# Patient Record
Sex: Female | Born: 1965 | Race: Black or African American | Hispanic: No | Marital: Married | Smoking: Never smoker
Health system: Southern US, Community
[De-identification: ages and names within clinical notes are randomized; demographics above are authoritative.]

## PROBLEM LIST (undated history)

## (undated) DIAGNOSIS — R112 Nausea with vomiting, unspecified: Secondary | ICD-10-CM

## (undated) DIAGNOSIS — D649 Anemia, unspecified: Secondary | ICD-10-CM

## (undated) DIAGNOSIS — M797 Fibromyalgia: Secondary | ICD-10-CM

## (undated) DIAGNOSIS — G43109 Migraine with aura, not intractable, without status migrainosus: Secondary | ICD-10-CM

## (undated) DIAGNOSIS — G43809 Other migraine, not intractable, without status migrainosus: Secondary | ICD-10-CM

## (undated) DIAGNOSIS — Z9889 Other specified postprocedural states: Secondary | ICD-10-CM

## (undated) DIAGNOSIS — E282 Polycystic ovarian syndrome: Secondary | ICD-10-CM

## (undated) HISTORY — PX: APPENDECTOMY: SHX54

## (undated) HISTORY — PX: ABDOMINAL HYSTERECTOMY: SHX81

---

## 2007-05-14 ENCOUNTER — Ambulatory Visit: Payer: Self-pay | Admitting: Internal Medicine

## 2007-10-04 ENCOUNTER — Ambulatory Visit: Payer: Self-pay | Admitting: Family Medicine

## 2009-01-28 ENCOUNTER — Ambulatory Visit: Payer: Self-pay | Admitting: Family Medicine

## 2012-05-30 ENCOUNTER — Ambulatory Visit: Payer: Self-pay

## 2012-05-30 LAB — CBC WITH DIFFERENTIAL/PLATELET
Basophil #: 0.1 10*3/uL (ref 0.0–0.1)
Basophil %: 1 %
Eosinophil #: 0.1 10*3/uL (ref 0.0–0.7)
Eosinophil %: 1.3 %
HCT: 39.7 % (ref 35.0–47.0)
HGB: 13.1 g/dL (ref 12.0–16.0)
Lymphocyte %: 27.6 %
MCH: 32.3 pg (ref 26.0–34.0)
MCHC: 33 g/dL (ref 32.0–36.0)
MCV: 98 fL (ref 80–100)
Monocyte %: 6.6 %
Neutrophil #: 3.3 10*3/uL (ref 1.4–6.5)
Platelet: 160 10*3/uL (ref 150–440)
RDW: 13.6 % (ref 11.5–14.5)

## 2012-05-30 LAB — URINALYSIS, COMPLETE
Bilirubin,UR: NEGATIVE
Ketone: NEGATIVE
Leukocyte Esterase: NEGATIVE
Nitrite: NEGATIVE
Specific Gravity: 1.01 (ref 1.003–1.030)
WBC UR: NONE SEEN /HPF (ref 0–5)

## 2012-05-30 LAB — BASIC METABOLIC PANEL
BUN: 14 mg/dL (ref 7–18)
Chloride: 103 mmol/L (ref 98–107)
EGFR (Non-African Amer.): >60

## 2012-08-01 ENCOUNTER — Ambulatory Visit: Payer: Self-pay | Admitting: Family Medicine

## 2014-02-20 ENCOUNTER — Emergency Department: Payer: Self-pay | Admitting: Emergency Medicine

## 2014-02-20 LAB — COMPREHENSIVE METABOLIC PANEL
AST: 17 U/L (ref 15–37)
Albumin: 3.9 g/dL (ref 3.4–5.0)
Alkaline Phosphatase: 47 U/L
Anion Gap: 5 — ABNORMAL LOW (ref 7–16)
BUN: 20 mg/dL — AB (ref 7–18)
Bilirubin,Total: 0.3 mg/dL (ref 0.2–1.0)
Calcium, Total: 9.7 mg/dL (ref 8.5–10.1)
Chloride: 105 mmol/L (ref 98–107)
Co2: 29 mmol/L (ref 21–32)
Creatinine: 0.77 mg/dL (ref 0.60–1.30)
EGFR (African American): >60
EGFR (Non-African Amer.): >60
Glucose: 98 mg/dL (ref 65–99)
Osmolality: 280 (ref 275–301)
Potassium: 4.5 mmol/L (ref 3.5–5.1)
SGPT (ALT): 18 U/L (ref 12–78)
Sodium: 139 mmol/L (ref 136–145)
Total Protein: 7.6 g/dL (ref 6.4–8.2)

## 2014-02-20 LAB — CK TOTAL AND CKMB (NOT AT ARMC)
CK, TOTAL: 38 U/L
CK-MB: <0.5 ng/mL — ABNORMAL LOW (ref 0.5–3.6)

## 2014-02-20 LAB — URINALYSIS, COMPLETE
BACTERIA: NONE SEEN
Bilirubin,UR: NEGATIVE
Glucose,UR: NEGATIVE mg/dL (ref 0–75)
KETONE: NEGATIVE
Leukocyte Esterase: NEGATIVE
Nitrite: NEGATIVE
Ph: 6 (ref 4.5–8.0)
Protein: NEGATIVE
SPECIFIC GRAVITY: 1.004 (ref 1.003–1.030)
Squamous Epithelial: <1
WBC UR: 1 /HPF (ref 0–5)

## 2014-02-20 LAB — CBC
HCT: 39.8 % (ref 35.0–47.0)
HGB: 13.2 g/dL (ref 12.0–16.0)
MCH: 32.4 pg (ref 26.0–34.0)
MCHC: 33.1 g/dL (ref 32.0–36.0)
MCV: 98 fL (ref 80–100)
Platelet: 142 10*3/uL — ABNORMAL LOW (ref 150–440)
RBC: 4.06 10*6/uL (ref 3.80–5.20)
RDW: 12.8 % (ref 11.5–14.5)
WBC: 8.2 10*3/uL (ref 3.6–11.0)

## 2014-02-20 LAB — LIPASE, BLOOD: Lipase: 185 U/L (ref 73–393)

## 2014-03-16 ENCOUNTER — Ambulatory Visit: Payer: Self-pay | Admitting: Family Medicine

## 2014-07-28 ENCOUNTER — Ambulatory Visit: Payer: Self-pay | Admitting: Neurology

## 2015-03-05 ENCOUNTER — Ambulatory Visit
Admission: EM | Admit: 2015-03-05 | Discharge: 2015-03-05 | Disposition: A | Discharge status: Home / Self Care | Payer: Managed Care, Other (non HMO) | Attending: Family Medicine | Admitting: Family Medicine

## 2015-03-05 DIAGNOSIS — R1012 Left upper quadrant pain: Secondary | ICD-10-CM | POA: Insufficient documentation

## 2015-03-05 DIAGNOSIS — K2970 Gastritis, unspecified, without bleeding: Secondary | ICD-10-CM | POA: Diagnosis not present

## 2015-03-05 DIAGNOSIS — K5900 Constipation, unspecified: Secondary | ICD-10-CM | POA: Diagnosis not present

## 2015-03-05 HISTORY — DX: Polycystic ovarian syndrome: E28.2

## 2015-03-05 HISTORY — DX: Migraine with aura, not intractable, without status migrainosus: G43.109

## 2015-03-05 HISTORY — DX: Other migraine, not intractable, without status migrainosus: G43.809

## 2015-03-05 HISTORY — DX: Fibromyalgia: M79.7

## 2015-03-05 LAB — URINALYSIS COMPLETE WITH MICROSCOPIC (ARMC ONLY)
BACTERIA UA: NONE SEEN — AB
Bilirubin Urine: NEGATIVE
GLUCOSE, UA: NEGATIVE mg/dL
Hgb urine dipstick: NEGATIVE
Ketones, ur: NEGATIVE mg/dL
Leukocytes, UA: NEGATIVE
NITRITE: NEGATIVE
PROTEIN: NEGATIVE mg/dL
RBC / HPF: NONE SEEN RBC/hpf (ref <3)
Specific Gravity, Urine: 1.01 (ref 1.005–1.030)
WBC UA: NONE SEEN WBC/hpf (ref <3)
pH: 7.5 (ref 5.0–8.0)

## 2015-03-05 NOTE — Discharge Instructions (Signed)
Discussed with patient foods to decrease that cause constipation. Also recommended trying a PPI. She is to follow-up with her primary care physician and/or gastroenterologist if symptoms persist or worsen. Would recommend further imaging with CT if symptoms do persist or worsen.

## 2015-03-05 NOTE — ED Notes (Signed)
Pt states "I developed abdominal pain after eating a banana this morning, and it has gotten worse as day has gone by. I drank a tea to clear my bowels yesterday, and it did clean me out. I have had bloating for about 3 months."

## 2015-03-05 NOTE — ED Provider Notes (Signed)
Patient presents today with symptoms of epigastric pain and left upper quadrant pain. Patient states that her symptoms began after eating a banana this morning. Patient states the symptoms are better now some. Patient admits to having chronic bloating. She denies any weight loss, chest pain, SOB, fever, nausea, vomiting, diarrhea, urinary symptoms, vaginal discharge. She does have a history of chronic constipation. She did take a tea yesterday that helped with her constipation. She admits to having a colonoscopy and endoscopy a few years ago. She is unsure about the results.   ROS: Negative except mentioned above. Vitals: As per chart GENERAL: NAD HEENT: no pharyngeal erythema, no exudate, no erythema of TMs RESP: CTA B CARD: RRR ABD: +BS, NT, ND, mild epigastric and left upper quadrant tenderness extending some to left flank, no mass appreciated NEURO: CN II-XII groslly intact   A/P: Epigastric and LUQ Pain, Constipation- discussed ways to decrease constipation, her symptoms may also be related to gastritis as well so she will try a PPI like Prilosec for 7-10 days to see if any relief. If her symptoms do persist or worsen I would recommend further imaging of the area. She should follow-up with her primary care physician next week regarding this.  Jolene Provost, MD 03/05/15 [phone]

## 2015-03-23 ENCOUNTER — Ambulatory Visit
Admission: RE | Admit: 2015-03-23 | Discharge: 2015-03-23 | Disposition: A | Discharge status: Home / Self Care | Payer: Managed Care, Other (non HMO) | Source: Ambulatory Visit | Attending: Family Medicine | Admitting: Family Medicine

## 2015-03-23 ENCOUNTER — Other Ambulatory Visit: Payer: Self-pay | Admitting: Family Medicine

## 2015-03-23 DIAGNOSIS — R928 Other abnormal and inconclusive findings on diagnostic imaging of breast: Secondary | ICD-10-CM | POA: Insufficient documentation

## 2015-03-23 DIAGNOSIS — Z1231 Encounter for screening mammogram for malignant neoplasm of breast: Secondary | ICD-10-CM

## 2015-03-31 ENCOUNTER — Other Ambulatory Visit: Payer: Self-pay | Admitting: Family Medicine

## 2015-03-31 DIAGNOSIS — R928 Other abnormal and inconclusive findings on diagnostic imaging of breast: Secondary | ICD-10-CM

## 2015-03-31 DIAGNOSIS — N63 Unspecified lump in unspecified breast: Secondary | ICD-10-CM

## 2015-04-02 ENCOUNTER — Ambulatory Visit
Admission: RE | Admit: 2015-04-02 | Discharge: 2015-04-02 | Disposition: A | Discharge status: Home / Self Care | Payer: Managed Care, Other (non HMO) | Source: Ambulatory Visit | Attending: Family Medicine | Admitting: Family Medicine

## 2015-04-02 DIAGNOSIS — R928 Other abnormal and inconclusive findings on diagnostic imaging of breast: Secondary | ICD-10-CM

## 2015-04-02 DIAGNOSIS — N63 Unspecified lump in unspecified breast: Secondary | ICD-10-CM

## 2015-04-02 DIAGNOSIS — N6002 Solitary cyst of left breast: Secondary | ICD-10-CM | POA: Diagnosis not present

## 2015-05-13 ENCOUNTER — Other Ambulatory Visit: Payer: Managed Care, Other (non HMO)

## 2015-05-13 ENCOUNTER — Encounter: Payer: Self-pay | Admitting: *Deleted

## 2015-05-13 NOTE — Patient Instructions (Signed)
  Your procedure is scheduled on: 05-20-15 Report to [facility] To find out your arrival time please call [phone] between 1PM - 3PM on 05-19-15  Remember: Instructions that are not followed completely may result in serious medical risk, up to and including death, or upon the discretion of your surgeon and anesthesiologist your surgery may need to be rescheduled.    _X___ 1. Do not eat food or drink liquids after midnight. No gum chewing or hard candies.     _X___ 2. No Alcohol for 24 hours before or after surgery.   ____ 3. Bring all medications with you on the day of surgery if instructed.    ____ 4. Notify your doctor if there is any change in your medical condition     (cold, fever, infections).     Do not wear jewelry, make-up, hairpins, clips or nail polish.  Do not wear lotions, powders, or perfumes. You may wear deodorant.  Do not shave 48 hours prior to surgery. Men may shave face and neck.  Do not bring valuables to the hospital.    [facility] is not responsible for any belongings or valuables.               Contacts, dentures or bridgework may not be worn into surgery.  Leave your suitcase in the car. After surgery it may be brought to your room.  For patients admitted to the hospital, discharge time is determined by your  treatment team.   Patients discharged the day of surgery will not be allowed to drive home.   Please read over the following fact sheets that you were given:      ____ Take these medicines the morning of surgery with A SIP OF WATER:    1. NONE  2.   3.   4.  5.  6.  ____ Fleet Enema (as directed)   ____ Use CHG Soap as directed  ____ Use inhalers on the day of surgery  ____ Stop metformin 2 days prior to surgery    ____ Take 1/2 of usual insulin dose the night before surgery and none on the morning of surgery.   ____ Stop Coumadin/Plavix/aspirin-N/A  ____ Stop Anti-inflammatories-NO NSAIDS OR ASA PRODUCTS-TYLENOL  OK   ____ Stop supplements until after surgery.    ____ Bring C-Pap to the hospital.

## 2015-05-20 ENCOUNTER — Ambulatory Visit: Payer: Managed Care, Other (non HMO) | Admitting: Anesthesiology

## 2015-05-20 ENCOUNTER — Encounter: Payer: Self-pay | Admitting: *Deleted

## 2015-05-20 ENCOUNTER — Ambulatory Visit
Admission: RE | Admit: 2015-05-20 | Discharge: 2015-05-20 | Disposition: A | Discharge status: Home / Self Care | Payer: Managed Care, Other (non HMO) | Source: Ambulatory Visit | Attending: Orthopedic Surgery | Admitting: Orthopedic Surgery

## 2015-05-20 ENCOUNTER — Encounter
Admission: RE | Disposition: A | Discharge status: Home / Self Care | Payer: Self-pay | Source: Ambulatory Visit | Attending: Orthopedic Surgery

## 2015-05-20 DIAGNOSIS — B009 Herpesviral infection, unspecified: Secondary | ICD-10-CM | POA: Insufficient documentation

## 2015-05-20 DIAGNOSIS — K589 Irritable bowel syndrome without diarrhea: Secondary | ICD-10-CM | POA: Diagnosis not present

## 2015-05-20 DIAGNOSIS — M797 Fibromyalgia: Secondary | ICD-10-CM | POA: Insufficient documentation

## 2015-05-20 DIAGNOSIS — G43909 Migraine, unspecified, not intractable, without status migrainosus: Secondary | ICD-10-CM | POA: Insufficient documentation

## 2015-05-20 DIAGNOSIS — F329 Major depressive disorder, single episode, unspecified: Secondary | ICD-10-CM | POA: Diagnosis not present

## 2015-05-20 DIAGNOSIS — M85641 Other cyst of bone, right hand: Secondary | ICD-10-CM | POA: Diagnosis not present

## 2015-05-20 DIAGNOSIS — E282 Polycystic ovarian syndrome: Secondary | ICD-10-CM | POA: Diagnosis not present

## 2015-05-20 DIAGNOSIS — Z90710 Acquired absence of both cervix and uterus: Secondary | ICD-10-CM | POA: Diagnosis not present

## 2015-05-20 DIAGNOSIS — M779 Enthesopathy, unspecified: Secondary | ICD-10-CM | POA: Insufficient documentation

## 2015-05-20 DIAGNOSIS — Z79899 Other long term (current) drug therapy: Secondary | ICD-10-CM | POA: Insufficient documentation

## 2015-05-20 HISTORY — DX: Nausea with vomiting, unspecified: R11.2

## 2015-05-20 HISTORY — DX: Anemia, unspecified: D64.9

## 2015-05-20 HISTORY — PX: EXCISION METACARPAL MASS: SHX6372

## 2015-05-20 HISTORY — DX: Nausea with vomiting, unspecified: Z98.890

## 2015-05-20 SURGERY — EXCISION METACARPAL MASS
Anesthesia: General | Laterality: Right | Wound class: Clean

## 2015-05-20 MED ORDER — FAMOTIDINE 20 MG PO TABS
20.0000 mg | ORAL_TABLET | Freq: Once | ORAL | Status: AC
  Administered 2015-05-20: 20 mg via ORAL

## 2015-05-20 MED ORDER — ONDANSETRON HCL 4 MG PO TABS: 4.0000 mg | ORAL_TABLET | Freq: Four times a day (QID) | ORAL | Status: DC | PRN

## 2015-05-20 MED ORDER — METOCLOPRAMIDE HCL 5 MG/ML IJ SOLN: 5.0000 mg | Freq: Three times a day (TID) | INTRAMUSCULAR | Status: DC | PRN

## 2015-05-20 MED ORDER — ONDANSETRON HCL 4 MG/2ML IJ SOLN: 4.0000 mg | Freq: Four times a day (QID) | INTRAMUSCULAR | Status: DC | PRN

## 2015-05-20 MED ORDER — SODIUM CHLORIDE 0.9 % IV SOLN: INTRAVENOUS | Status: DC

## 2015-05-20 MED ORDER — METOCLOPRAMIDE HCL 10 MG PO TABS: 5.0000 mg | ORAL_TABLET | Freq: Three times a day (TID) | ORAL | Status: DC | PRN

## 2015-05-20 MED ORDER — ONDANSETRON HCL 4 MG/2ML IJ SOLN: 4.0000 mg | Freq: Once | INTRAMUSCULAR | Status: DC | PRN

## 2015-05-20 MED ORDER — FENTANYL CITRATE (PF) 100 MCG/2ML IJ SOLN
INTRAMUSCULAR | Status: AC
  Administered 2015-05-20: 25 ug via INTRAVENOUS
  Filled 2015-05-20: qty 2

## 2015-05-20 MED ORDER — PROPOFOL 10 MG/ML IV BOLUS
INTRAVENOUS | Status: DC | PRN
  Administered 2015-05-20: 120 mg via INTRAVENOUS

## 2015-05-20 MED ORDER — HYDROCODONE-ACETAMINOPHEN 5-325 MG PO TABS: 1.0000 | ORAL_TABLET | Freq: Four times a day (QID) | ORAL | Status: DC | PRN

## 2015-05-20 MED ORDER — BUPIVACAINE HCL (PF) 0.5 % IJ SOLN
INTRAMUSCULAR | Status: DC | PRN
  Administered 2015-05-20: 10 mL

## 2015-05-20 MED ORDER — EPHEDRINE SULFATE 50 MG/ML IJ SOLN
INTRAMUSCULAR | Status: DC | PRN
  Administered 2015-05-20 (×2): 5 mg via INTRAVENOUS

## 2015-05-20 MED ORDER — NEOMYCIN-POLYMYXIN B GU 40-200000 IR SOLN
Status: AC
  Filled 2015-05-20: qty 2

## 2015-05-20 MED ORDER — FENTANYL CITRATE (PF) 100 MCG/2ML IJ SOLN
INTRAMUSCULAR | Status: DC | PRN
  Administered 2015-05-20 (×2): 25 ug via INTRAVENOUS

## 2015-05-20 MED ORDER — MIDAZOLAM HCL 2 MG/2ML IJ SOLN
INTRAMUSCULAR | Status: DC | PRN
  Administered 2015-05-20: 2 mg via INTRAVENOUS

## 2015-05-20 MED ORDER — BUPIVACAINE HCL (PF) 0.5 % IJ SOLN
INTRAMUSCULAR | Status: AC
  Filled 2015-05-20: qty 30

## 2015-05-20 MED ORDER — LACTATED RINGERS IV SOLN
INTRAVENOUS | Status: DC
  Administered 2015-05-20: 11:00:00 via INTRAVENOUS

## 2015-05-20 MED ORDER — DEXAMETHASONE SODIUM PHOSPHATE 4 MG/ML IJ SOLN
INTRAMUSCULAR | Status: DC | PRN
  Administered 2015-05-20: 5 mg via INTRAVENOUS

## 2015-05-20 MED ORDER — FENTANYL CITRATE (PF) 100 MCG/2ML IJ SOLN
25.0000 ug | INTRAMUSCULAR | Status: DC | PRN
  Administered 2015-05-20 (×4): 25 ug via INTRAVENOUS

## 2015-05-20 MED ORDER — HYDROCODONE-ACETAMINOPHEN 5-325 MG PO TABS: 1.0000 | ORAL_TABLET | ORAL | Status: DC | PRN

## 2015-05-20 MED ORDER — FAMOTIDINE 20 MG PO TABS
ORAL_TABLET | ORAL | Status: AC
  Administered 2015-05-20: 20 mg via ORAL
  Filled 2015-05-20: qty 1

## 2015-05-20 MED ORDER — LIDOCAINE HCL (PF) 1 % IJ SOLN
INTRAMUSCULAR | Status: AC
  Administered 2015-05-20: 2 mL via SUBCUTANEOUS
  Filled 2015-05-20: qty 2

## 2015-05-20 MED ORDER — LIDOCAINE HCL (CARDIAC) 20 MG/ML IV SOLN
INTRAVENOUS | Status: DC | PRN
  Administered 2015-05-20: 60 mg via INTRAVENOUS

## 2015-05-20 SURGICAL SUPPLY — 21 items
BNDG ESMARK 4X12 TAN STRL LF (GAUZE/BANDAGES/DRESSINGS) ×3 IMPLANT
CANISTER SUCT 1200ML W/VALVE (MISCELLANEOUS) ×3 IMPLANT
DRAPE FLUOR MINI C-ARM 54X84 (DRAPES) ×3 IMPLANT
GAUZE PETRO XEROFOAM 1X8 (MISCELLANEOUS) ×3 IMPLANT
GAUZE SPONGE 4X4 12PLY STRL (GAUZE/BANDAGES/DRESSINGS) ×3 IMPLANT
GAUZE STRETCH 2X75IN STRL (MISCELLANEOUS) ×3 IMPLANT
GLOVE BIOGEL PI IND STRL 9 (GLOVE) ×1 IMPLANT
GLOVE BIOGEL PI INDICATOR 9 (GLOVE) ×2
GLOVE SURG ORTHO 9.0 STRL STRW (GLOVE) ×3 IMPLANT
GOWN SPECIALTY ULTRA XL (MISCELLANEOUS) ×3 IMPLANT
GOWN STRL REUS W/ TWL LRG LVL3 (GOWN DISPOSABLE) ×1 IMPLANT
GOWN STRL REUS W/TWL LRG LVL3 (GOWN DISPOSABLE) ×2
KIT RM TURNOVER STRD PROC AR (KITS) ×3 IMPLANT
NEEDLE HYPO 25X1 1.5 SAFETY (NEEDLE) ×3 IMPLANT
NS IRRIG 500ML POUR BTL (IV SOLUTION) ×3 IMPLANT
PACK EXTREMITY ARMC (MISCELLANEOUS) ×3 IMPLANT
PAD GROUND ADULT SPLIT (MISCELLANEOUS) ×3 IMPLANT
STOCKINETTE STRL 6IN 960660 (GAUZE/BANDAGES/DRESSINGS) ×3 IMPLANT
SUT ETHILON 4-0 (SUTURE) ×2
SUT ETHILON 4-0 FS2 18XMFL BLK (SUTURE) ×1
SUTURE ETHLN 4-0 FS2 18XMF BLK (SUTURE) ×1 IMPLANT

## 2015-05-20 NOTE — Anesthesia Preprocedure Evaluation (Signed)
Anesthesia Evaluation  Patient identified by MRN, date of birth, ID band Patient awake    Reviewed: Allergy & Precautions, NPO status , Patient's Chart, lab work & pertinent test results  History of Anesthesia Complications (+) PONV and history of anesthetic complications  Airway Mallampati: II  TM Distance: >3 FB Neck ROM: Full    Dental no notable dental hx.    Pulmonary neg pulmonary ROS,  breath sounds clear to auscultation  Pulmonary exam normal       Cardiovascular negative cardio ROS Normal cardiovascular exam    Neuro/Psych  Headaches, negative psych ROS   GI/Hepatic negative GI ROS, Neg liver ROS,   Endo/Other  negative endocrine ROS  Renal/GU negative Renal ROS  Female GU complaint Polycystic ovaries    Musculoskeletal  (+) Fibromyalgia -  Abdominal Normal abdominal exam  (+)   Peds negative pediatric ROS (+)  Hematology  (+) anemia ,   Anesthesia Other Findings   Reproductive/Obstetrics                             Anesthesia Physical Anesthesia Plan  ASA: II  Anesthesia Plan: General   Post-op Pain Management:    Induction: Intravenous  Airway Management Planned: LMA  Additional Equipment:   Intra-op Plan:   Post-operative Plan: Extubation in OR  Informed Consent: I have reviewed the patients History and Physical, chart, labs and discussed the procedure including the risks, benefits and alternatives for the proposed anesthesia with the patient or authorized representative who has indicated his/her understanding and acceptance.   Dental advisory given  Plan Discussed with: CRNA and Surgeon  Anesthesia Plan Comments:         Anesthesia Quick Evaluation

## 2015-05-20 NOTE — Discharge Instructions (Signed)
Keep arm elevated. Don't worry if there is a small amount of bloody drainage. Keep bandage clean and dry as possible.

## 2015-05-20 NOTE — Op Note (Signed)
05/20/2015  12:42 PM  PATIENT:  Patricia Zhang  49 y.o. female  PRE-OPERATIVE DIAGNOSIS:  BONESPUR PIP JOINT RIGHT FINGER PAIN  POST-OPERATIVE DIAGNOSIS:  Same  PROCEDURE:  Procedure(s): EXCISION METACARPAL MASS (Right) finger mass  SURGEON: Leitha Schuller, MD  ASSISTANTS: None  ANESTHESIA:   general  EBL:  Total I/O In: 50 [I.V.:50] Out: -   BLOOD ADMINISTERED:none  DRAINS: none   LOCAL MEDICATIONS USED:  MARCAINE     SPECIMEN:  Source of Specimen:  Middle phalanx right index finger  DISPOSITION OF SPECIMEN:  PATHOLOGY  COUNTS:  YES  TOURNIQUET:   21 minutes at 250 mmHg  IMPLANTS: None  DICTATION: .Dragon Dictation patient brought the operating room and after adequate general anesthesia was obtained the right arm was prepped and draped in sterile fashion. After patient identification and timeout procedures were completed tourniquet was raised to her 50 murmurs mercury. A mid axial incision was made on the ulnar border of the middle phalanx crossing the PIP joint op so. A longitudinal incision was made and slightly dorsal to the midline and the bone spur exposed had normal appearance of bone Aundria Rud used to remove this there is some small pieces of crystalline material which is being sent as a separate specimen mini C-arm was brought in and the entire spur appeared to be have been removed without removing normal bone. The wound was then irrigated and closed with simple 4-0 nylon with 10 cc of half percent Sensorcaine placed as a digital block for postop analgesia Xeroform 4 x 4 and finger roll were applied  PLAN OF CARE: Discharge to home after PACU  PATIENT DISPOSITION:  PACU - hemodynamically stable.

## 2015-05-20 NOTE — Transfer of Care (Signed)
Immediate Anesthesia Transfer of Care Note  Patient: Patricia Zhang  Procedure(s) Performed: Procedure(s): EXCISION METACARPAL MASS (Right)  Patient Location: PACU  Anesthesia Type:General  Level of Consciousness: awake and alert   Airway & Oxygen Therapy: Patient Spontanous Breathing and Patient connected to face mask oxygen  Post-op Assessment: Report given to RN and Post -op Vital signs reviewed and stable  Post vital signs: Reviewed and stable  Last Vitals:  Filed Vitals:   05/20/15 1248  BP: 113/68  Pulse: 67  Temp: 36.4 C  Resp: 12    Complications: No apparent anesthesia complications

## 2015-05-20 NOTE — Transfer of Care (Signed)
Immediate Anesthesia Transfer of Care Note  Patient: Patricia Zhang  Procedure(s) Performed: Procedure(s): EXCISION METACARPAL MASS (Right)  Patient Location: PACU  Anesthesia Type:General  Level of Consciousness: awake, alert  and oriented  Airway & Oxygen Therapy: Patient Spontanous Breathing and Patient connected to face mask oxygen  Post-op Assessment: Report given to RN and Post -op Vital signs reviewed and stable  Post vital signs: Reviewed and stable  Last Vitals:  Filed Vitals:   05/20/15 1248  BP: 113/68  Pulse: 67  Temp: 36.4 C  Resp: 12    Complications: No apparent anesthesia complications

## 2015-05-20 NOTE — H&P (Signed)
Reviewed paper H+P, will be scanned into chart. No changes noted.  

## 2015-05-20 NOTE — Anesthesia Procedure Notes (Signed)
Procedure Name: LMA Insertion Date/Time: 05/20/2015 12:00 PM Performed by: Omer Jack Pre-anesthesia Checklist: Patient identified, Patient being monitored, Timeout performed, Emergency Drugs available and Suction available Patient Re-evaluated:Patient Re-evaluated prior to inductionOxygen Delivery Method: Circle system utilized Preoxygenation: Pre-oxygenation with 100% oxygen Intubation Type: IV induction Ventilation: Mask ventilation without difficulty LMA: LMA inserted LMA Size: 4.0 Tube type: Oral Number of attempts: 1 Placement Confirmation: positive ETCO2 and breath sounds checked- equal and bilateral Tube secured with: Tape Dental Injury: Teeth and Oropharynx as per pre-operative assessment

## 2015-05-21 LAB — SURGICAL PATHOLOGY

## 2015-05-25 NOTE — Anesthesia Postprocedure Evaluation (Signed)
  Anesthesia Post-op Note  Patient: Patricia Zhang  Procedure(s) Performed: Procedure(s): EXCISION METACARPAL MASS (Right)  Anesthesia type:General  Patient location: PACU  Post pain: Pain level controlled  Post assessment: Post-op Vital signs reviewed, Patient's Cardiovascular Status Stable, Respiratory Function Stable, Patent Airway and No signs of Nausea or vomiting  Post vital signs: Reviewed and stable  Last Vitals:  Filed Vitals:   05/20/15 1418  BP: 116/69  Pulse: 58  Temp:   Resp: 16    Level of consciousness: awake, alert  and patient cooperative  Complications: No apparent anesthesia complications

## 2016-03-23 ENCOUNTER — Other Ambulatory Visit: Payer: Self-pay | Admitting: Family Medicine

## 2016-03-23 DIAGNOSIS — N9489 Other specified conditions associated with female genital organs and menstrual cycle: Secondary | ICD-10-CM

## 2016-03-27 ENCOUNTER — Ambulatory Visit
Admission: RE | Admit: 2016-03-27 | Discharge: 2016-03-27 | Disposition: A | Discharge status: Home / Self Care | Payer: Managed Care, Other (non HMO) | Source: Ambulatory Visit | Attending: Family Medicine | Admitting: Family Medicine

## 2016-03-27 DIAGNOSIS — Z90710 Acquired absence of both cervix and uterus: Secondary | ICD-10-CM | POA: Diagnosis not present

## 2016-03-27 DIAGNOSIS — N949 Unspecified condition associated with female genital organs and menstrual cycle: Secondary | ICD-10-CM | POA: Insufficient documentation

## 2016-03-27 DIAGNOSIS — N9489 Other specified conditions associated with female genital organs and menstrual cycle: Secondary | ICD-10-CM

## 2016-03-27 MED ORDER — IOPAMIDOL (ISOVUE-300) INJECTION 61%
125.0000 mL | Freq: Once | INTRAVENOUS | Status: AC | PRN
  Administered 2016-03-27: 125 mL via INTRAVENOUS

## 2016-09-12 ENCOUNTER — Other Ambulatory Visit: Payer: Self-pay | Admitting: Family Medicine

## 2016-09-12 DIAGNOSIS — Z1231 Encounter for screening mammogram for malignant neoplasm of breast: Secondary | ICD-10-CM

## 2016-09-25 ENCOUNTER — Ambulatory Visit
Admission: RE | Admit: 2016-09-25 | Discharge: 2016-09-25 | Disposition: A | Discharge status: Home / Self Care | Payer: Managed Care, Other (non HMO) | Source: Ambulatory Visit | Attending: Family Medicine | Admitting: Family Medicine

## 2016-09-25 DIAGNOSIS — Z1231 Encounter for screening mammogram for malignant neoplasm of breast: Secondary | ICD-10-CM

## 2016-10-10 ENCOUNTER — Other Ambulatory Visit: Payer: Self-pay | Admitting: Family Medicine

## 2016-10-10 DIAGNOSIS — R921 Mammographic calcification found on diagnostic imaging of breast: Secondary | ICD-10-CM

## 2016-10-10 DIAGNOSIS — N6489 Other specified disorders of breast: Secondary | ICD-10-CM

## 2016-10-10 DIAGNOSIS — R928 Other abnormal and inconclusive findings on diagnostic imaging of breast: Secondary | ICD-10-CM

## 2016-10-17 ENCOUNTER — Ambulatory Visit
Admission: RE | Admit: 2016-10-17 | Discharge: 2016-10-17 | Disposition: A | Discharge status: Home / Self Care | Payer: Managed Care, Other (non HMO) | Source: Ambulatory Visit | Attending: Family Medicine | Admitting: Family Medicine

## 2016-10-17 DIAGNOSIS — R928 Other abnormal and inconclusive findings on diagnostic imaging of breast: Secondary | ICD-10-CM | POA: Diagnosis present

## 2016-10-17 DIAGNOSIS — R921 Mammographic calcification found on diagnostic imaging of breast: Secondary | ICD-10-CM

## 2016-10-17 DIAGNOSIS — N6489 Other specified disorders of breast: Secondary | ICD-10-CM

## 2016-11-03 ENCOUNTER — Ambulatory Visit
Admission: EM | Admit: 2016-11-03 | Discharge: 2016-11-03 | Disposition: A | Discharge status: Home / Self Care | Payer: Managed Care, Other (non HMO) | Attending: Family Medicine | Admitting: Family Medicine

## 2016-11-03 ENCOUNTER — Encounter: Payer: Self-pay | Admitting: Emergency Medicine

## 2016-11-03 ENCOUNTER — Ambulatory Visit
Admission: RE | Admit: 2016-11-03 | Discharge: 2016-11-03 | Disposition: A | Discharge status: Home / Self Care | Payer: Managed Care, Other (non HMO) | Source: Ambulatory Visit | Attending: Emergency Medicine | Admitting: Emergency Medicine

## 2016-11-03 DIAGNOSIS — R1032 Left lower quadrant pain: Secondary | ICD-10-CM | POA: Diagnosis not present

## 2016-11-03 LAB — URINALYSIS, COMPLETE (UACMP) WITH MICROSCOPIC
BACTERIA UA: NONE SEEN
Bilirubin Urine: NEGATIVE
Glucose, UA: NEGATIVE mg/dL
Leukocytes, UA: NEGATIVE
Nitrite: NEGATIVE
Protein, ur: NEGATIVE mg/dL
SPECIFIC GRAVITY, URINE: 1.015 (ref 1.005–1.030)
pH: 5.5 (ref 5.0–8.0)

## 2016-11-03 LAB — BASIC METABOLIC PANEL
Anion gap: 7 (ref 5–15)
BUN: 17 mg/dL (ref 6–20)
CHLORIDE: 102 mmol/L (ref 101–111)
CO2: 28 mmol/L (ref 22–32)
CREATININE: 0.74 mg/dL (ref 0.44–1.00)
Calcium: 9.8 mg/dL (ref 8.9–10.3)
GFR calc non Af Amer: >60 mL/min (ref >60)
Glucose, Bld: 93 mg/dL (ref 65–99)
Potassium: 4.2 mmol/L (ref 3.5–5.1)
Sodium: 137 mmol/L (ref 135–145)

## 2016-11-03 LAB — CBC WITH DIFFERENTIAL/PLATELET
BASOS PCT: 1 %
Basophils Absolute: 0.1 10*3/uL (ref 0–0.1)
Eosinophils Absolute: 0 10*3/uL (ref 0–0.7)
Eosinophils Relative: 1 %
HEMATOCRIT: 41.4 % (ref 35.0–47.0)
HEMOGLOBIN: 13.9 g/dL (ref 12.0–16.0)
LYMPHS ABS: 1.7 10*3/uL (ref 1.0–3.6)
Lymphocytes Relative: 28 %
MCH: 31.3 pg (ref 26.0–34.0)
MCHC: 33.6 g/dL (ref 32.0–36.0)
MCV: 93.2 fL (ref 80.0–100.0)
MONOS PCT: 5 %
Monocytes Absolute: 0.3 10*3/uL (ref 0.2–0.9)
NEUTROS ABS: 3.9 10*3/uL (ref 1.4–6.5)
NEUTROS PCT: 65 %
Platelets: 198 10*3/uL (ref 150–440)
RBC: 4.45 MIL/uL (ref 3.80–5.20)
RDW: 13.3 % (ref 11.5–14.5)
WBC: 5.9 10*3/uL (ref 3.6–11.0)

## 2016-11-03 MED ORDER — IOPAMIDOL (ISOVUE-370) INJECTION 76%
125.0000 mL | Freq: Once | INTRAVENOUS | Status: AC | PRN
  Administered 2016-11-03: 125 mL via INTRAVENOUS

## 2016-11-03 NOTE — ED Triage Notes (Signed)
Patient c/o lower left abdominal pain that started yesterday.  Patient's last bowel movement yesterday and was hard.  Patient reports history of constipation.  Patient denies N/V/D.

## 2016-11-03 NOTE — Discharge Instructions (Signed)
Rest. Drink plenty of fluids.   Follow up with your primary care physician or OBGYN this week. Return to Urgent care or Emergency room increased pain, fever, for new or worsening concerns.

## 2016-11-03 NOTE — ED Notes (Signed)
Prior authorization completed by Dewaine CongerNancy Lyn Joens, approval number [id number] per Harrison MonsBlake good from 11/03/16 through 02/01/17

## 2016-11-03 NOTE — ED Provider Notes (Signed)
[facility] ____________________________________________  Time seen: Approximately 2:34 PM  I have reviewed the triage vital signs and the nursing notes.   HISTORY  Chief Complaint Abdominal Pain   HPI Patricia Zhang is a 51 y.o. female  Presents for complaints of left lower quadrant abdominal pain. Patient reports abdominal pain onset yesterday. Patient reports abdominal pain was severe yesterday, stating she had difficulty even walking. Patient reports abdominal pain has continued today, but better. Patient reports that she did have a bowel movement early yesterday morning and which was hard but consistent with her normal intermittent constipation. Patient reports she normally has a bowel movement every 1-2 [place] but this is been her normal pattern. Denies any abnormal color stools or atypical patterns. Patient reports she has not previously had abdominal pain similar to this. Patient describes pain currently as moderate. Denies alleviating factors.Denies known trigger.  Patient states she has had a hysterectomy, right oophortectomy, but has left ovary. Patient reports she does have a history of left ovarian cyst that have occurred intermittently. Patient reports her current pain is not consistent with her previous left ovarian pain. Denies any pelvic discomfort, vaginal pain, vaginal discharge, abnormal bleeding. Denies dysuria. Denies fevers. Denies recent sickness, cough, congestion or sore throat.  Denies chest pain, shortness of breath, dysuria, extremity pain, extremity swelling or rash. Denies recent sickness. Denies recent antibiotic use.   Dione Housekeeper, MD: PCP OBGYN: Kateri Mc   Past Medical History:  Diagnosis Date  . Anemia    H/O  . Fibromyalgia   . Polycystic ovaries   . PONV (postoperative nausea and vomiting)   . Vestibular migraine     There are no active problems to display for this patient.   Past Surgical History:  Procedure Laterality Date    . ABDOMINAL HYSTERECTOMY    . APPENDECTOMY    . EXCISION METACARPAL MASS Right 05/20/2015   Procedure: EXCISION METACARPAL MASS;  Surgeon: Kennedy Bucker, MD;  Location: [facility] ORS;  Service: Orthopedics;  Laterality: Right;     No current facility-administered medications for this encounter.   Current Outpatient Prescriptions:  .  cholecalciferol (VITAMIN D) 1000 UNITS tablet, Take 1,000 Units by mouth daily., Disp: , Rfl:  .  ibuprofen (ADVIL,MOTRIN) 200 MG tablet, Take 800 mg by mouth every 6 (six) hours as needed., Disp: , Rfl:  .  Magnesium Oxide 250 MG TABS, Take 1 tablet by mouth daily., Disp: , Rfl:  .  vitamin B-12 (CYANOCOBALAMIN) 1000 MCG tablet, Take 1,000 mcg by mouth daily., Disp: , Rfl:   Allergies Codeine  Family History  Problem Relation Age of Onset  . Breast cancer Neg Hx     Social History Social History  Substance Use Topics  . Smoking status: Never Smoker  . Smokeless tobacco: Never Used  . Alcohol use Yes     Comment: RARE    Review of Systems Constitutional: No fever/chills Eyes: No visual changes. ENT: No sore throat. Cardiovascular: Denies chest pain. Respiratory: Denies shortness of breath. Gastrointestinal: As above. Denies blood in stools/toilet/wiping, denies black stools.  Genitourinary: Negative for dysuria. Musculoskeletal: Negative for back pain. Skin: Negative for rash. Neurological: Negative for headaches, focal weakness or numbness.  10-point ROS otherwise negative.  ____________________________________________   PHYSICAL EXAM:  VITAL SIGNS: ED Triage Vitals  Enc Vitals Group     BP 11/03/16 1333 116/72     Pulse Rate 11/03/16 1333 69     Resp 11/03/16 1333 16     Temp  11/03/16 1333 98.3 F (36.8 C)     Temp Source 11/03/16 1333 Oral     SpO2 11/03/16 1333 100 %     Weight 11/03/16 1332 118 lb (53.5 kg)     Height 11/03/16 1332 5\' 2"  (1.575 m)     Head Circumference --      Peak Flow --      Pain Score 11/03/16 1333 3      Pain Loc --      Pain Edu? --      Excl. in GC? --     Constitutional: Alert and oriented. Well appearing and in no acute distress. Eyes: Conjunctivae are normal. PERRL. EOMI. ENT      Head: Normocephalic and atraumatic.      Nose: No congestion/rhinnorhea.      Mouth/Throat: Mucous membranes are moist. Oropharynx non-erythematous. Neck: No stridor. Supple without meningismus.  Hematological/Lymphatic/Immunilogical: No cervical lymphadenopathy. Cardiovascular: Normal rate, regular rhythm. Grossly normal heart sounds.  Good peripheral circulation. Respiratory: Normal respiratory effort without tachypnea nor retractions. Breath sounds are clear and equal bilaterally. No wheezes, rales, rhonchi. Gastrointestinal: Moderate tenderness to palpation left lower quadrant, mild tenderness left upper quadrant, abdomen otherwise soft and nontender. No suprapubic tenderness. Normal Bowel sounds. No CVA tenderness. Musculoskeletal:  ambulatory with steady gait. No midline cervical, thoracic or lumbar tenderness to palpation.  Neurologic:  Normal speech and language. Skin:  Skin is warm, dry and intact. No rash noted. Psychiatric: Mood and affect are normal. Speech and behavior are normal. Patient exhibits appropriate insight and judgment   ___________________________________________   LABS (all labs ordered are listed, but only abnormal results are displayed)  Labs Reviewed  URINALYSIS, COMPLETE (UACMP) WITH MICROSCOPIC - Abnormal; Notable for the following:       Result Value   Hgb urine dipstick TRACE (*)    Ketones, ur TRACE (*)    Squamous Epithelial / LPF 0-5 (*)    All other components within normal limits  CBC WITH DIFFERENTIAL/PLATELET  BASIC METABOLIC PANEL   ____________________________________________  Radiology EXAM: CT ABDOMEN AND PELVIS WITH CONTRAST  TECHNIQUE: Multidetector CT imaging of the abdomen and pelvis was performed using the standard protocol following  bolus administration of intravenous contrast.  CONTRAST:  125 mL Isovue 370 IV  COMPARISON:  CT abdomen pelvis 03/27/2016  FINDINGS: Lower chest: No pulmonary nodules. No visible pleural or pericardial effusion.  Hepatobiliary: Normal hepatic size and contours without focal liver lesion. No perihepatic ascites. No intra- or extrahepatic biliary dilatation. Normal gallbladder.  Pancreas: Normal pancreatic contours and enhancement. No peripancreatic fluid collection or pancreatic ductal dilatation.  Spleen: Normal.  Adrenals/Urinary Tract: Normal adrenal glands. No hydronephrosis or solid renal mass.  Stomach/Bowel: No abnormal bowel dilatation. No bowel wall thickening or adjacent fat stranding to indicate acute inflammation. No abdominal fluid collection. 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  Vascular/Lymphatic: Normal course and caliber of the major abdominal vessels. No abdominal or pelvic adenopathy.  Reproductive: Status post hysterectomy.  Musculoskeletal: No lytic or blastic osseous lesion. Normal visualized extrathoracic and extraperitoneal soft tissues.  Other: No contributory non-categorized findings.  IMPRESSION: No acute abnormality of the abdomen or pelvis.   Electronically Signed   By: Deatra Robinson M.D.   On: 11/03/2016 19:42 PROCEDURES Procedures    INITIAL IMPRESSION / ASSESSMENT AND PLAN / ED COURSE  Pertinent labs & imaging results that were available during my care of the patient were reviewed by me and considered in my medical decision making (see  chart for details).  Very well-appearing patient. No acute distress. Presents for the complaints of left lower quadrant abdominal pain. Patient is point tender left lower quadrant. Patient reports she does have a history of left ovarian cyst, but reports this is not consistent with her previous left ovarian cyst pain. Discussed evaluation by pelvic, patient denies as reports  not consistent with previous ovarian pain. Urinalysis reviewed. Patient expresses concern as no previous similar pain. Will evaluate CBC, BMP. Discussed patient evaluation of CT abdomen and pelvis vs pelvic ultrasound. Patient abdominally tender.  patient requests proceed with CT at this time. Discussed radiation risks, patient verbalized understanding and agreed to proceed with CT.  Lab results reviewed and discussed with patient. Labs unremarkable. Patient sitting comfortably appearing in room, no acute distress, reports pain improved. Awaiting CT.   1950: CT results reviewed. Per radiologist, no acute abnormality of the abdomen or pelvis. Discussed CT results with patient. Patient sitting comfortably in room. Patient reports pain has improved. No acute distress at this time noted. Discussed patient other differentials, including ovarian source of pain. Patient states pain that she has been having this does not fit with her previous ovarian issues and discussed in detail with patient need to follow-up with primary care physician as well as her OB/GYN. Discussed patient patient plans to follow-up with OB/GYN at beginning of this coming week. Discussed strict follow-up and return parameters, including proceeding to emergency room over the weekend as needed for any increased pain for further evaluation including likely ultrasound pelvic. Patient declines further evaluation at this time. Patient declines need for prescription medications and states she'll take over-the-counter Tylenol or ibuprofen as needed. Patient verbalized understanding and agrees to outpatient plan.  Discussed follow up with Primary care physician this week. Discussed follow up and return parameters including no resolution or any worsening concerns. Patient verbalized understanding and agreed to plan.   ____________________________________________   FINAL CLINICAL IMPRESSION(S) / ED DIAGNOSES  Final diagnoses:  Left lower quadrant  pain     Discharge Medication List as of 11/03/2016  7:54 PM      Note: This dictation was prepared with Dragon dictation along with smaller phrase technology. Any transcriptional errors that result from this process are unintentional.         Renford DillsLindsey Malajah Oceguera, NP 11/03/16 2059

## 2018-11-22 ENCOUNTER — Other Ambulatory Visit: Payer: Self-pay | Admitting: Family Medicine

## 2018-11-22 DIAGNOSIS — Z1231 Encounter for screening mammogram for malignant neoplasm of breast: Secondary | ICD-10-CM

## 2019-09-16 ENCOUNTER — Other Ambulatory Visit: Payer: Self-pay | Admitting: Obstetrics & Gynecology

## 2019-09-16 DIAGNOSIS — Z1231 Encounter for screening mammogram for malignant neoplasm of breast: Secondary | ICD-10-CM

## 2019-09-17 ENCOUNTER — Ambulatory Visit
Admission: RE | Admit: 2019-09-17 | Discharge: 2019-09-17 | Disposition: A | Discharge status: Home / Self Care | Payer: Managed Care, Other (non HMO) | Source: Ambulatory Visit | Attending: Obstetrics & Gynecology | Admitting: Obstetrics & Gynecology

## 2019-09-17 ENCOUNTER — Other Ambulatory Visit: Payer: Self-pay

## 2019-09-17 DIAGNOSIS — Z1231 Encounter for screening mammogram for malignant neoplasm of breast: Secondary | ICD-10-CM | POA: Diagnosis present

## 2020-05-17 ENCOUNTER — Other Ambulatory Visit: Payer: Self-pay | Admitting: Neurology

## 2020-05-17 DIAGNOSIS — G43809 Other migraine, not intractable, without status migrainosus: Secondary | ICD-10-CM

## 2020-05-17 DIAGNOSIS — R4189 Other symptoms and signs involving cognitive functions and awareness: Secondary | ICD-10-CM

## 2020-07-12 ENCOUNTER — Other Ambulatory Visit: Payer: Self-pay | Admitting: Neurology

## 2020-07-12 ENCOUNTER — Ambulatory Visit
Admission: RE | Admit: 2020-07-12 | Discharge: 2020-07-12 | Disposition: A | Discharge status: Home / Self Care | Payer: 59 | Source: Ambulatory Visit | Attending: Neurology | Admitting: Neurology

## 2020-07-12 ENCOUNTER — Other Ambulatory Visit: Payer: Self-pay

## 2020-07-12 DIAGNOSIS — G43809 Other migraine, not intractable, without status migrainosus: Secondary | ICD-10-CM

## 2020-07-12 DIAGNOSIS — R4189 Other symptoms and signs involving cognitive functions and awareness: Secondary | ICD-10-CM

## 2020-07-12 MED ORDER — GADOBUTROL 1 MMOL/ML IV SOLN: 5.0000 mL | Freq: Once | INTRAVENOUS | Status: DC | PRN

## 2022-01-04 ENCOUNTER — Other Ambulatory Visit: Payer: Self-pay | Admitting: Student

## 2022-01-04 DIAGNOSIS — G43809 Other migraine, not intractable, without status migrainosus: Secondary | ICD-10-CM

## 2022-01-12 ENCOUNTER — Ambulatory Visit: Payer: 59

## 2022-02-27 IMAGING — MR MR HEAD W/O CM
12 series · 48 of 48 positions shown · non-contrast
Comparison: MRI of the brain July 28, 2014.

CLINICAL DATA: Frontal headache.  Migraines.

EXAM:
MRI HEAD WITHOUT CONTRAST
TECHNIQUE: Multiplanar, multiecho pulse sequences of the brain and surrounding
structures were obtained without intravenous contrast.

[Series 5: ax dwi_tracew · axial · 3.0mm · 0.60mm/px · z∈[-165,-19]mm · 3 of 48 slices shown]
[im 1/48]
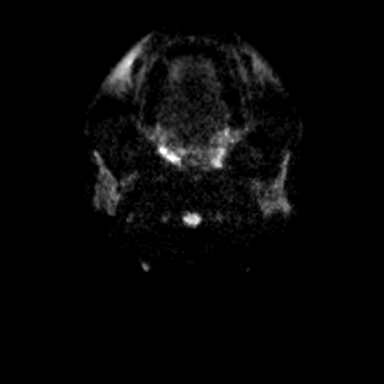
[im 24/48]
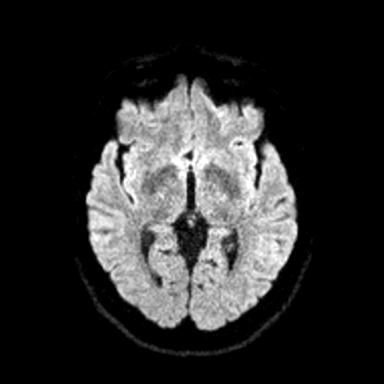
[im 48/48]
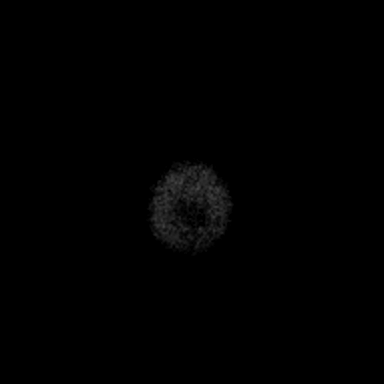

[Series 6: ax dwi_adc · axial · 3.0mm · 0.60mm/px · z∈[-165,-19]mm · 4 of 48 slices shown]
[im 1/48]
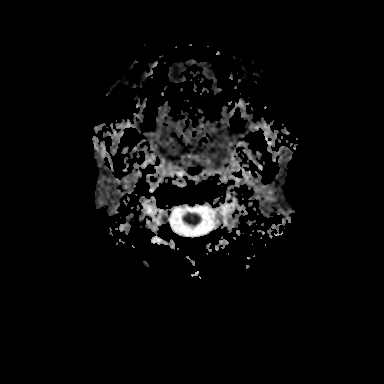
[im 16/48]
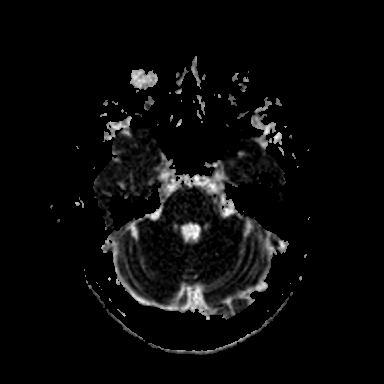
[im 32/48]
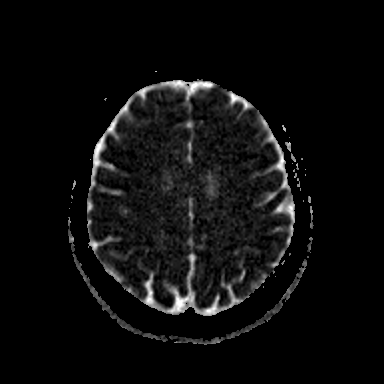
[im 48/48]
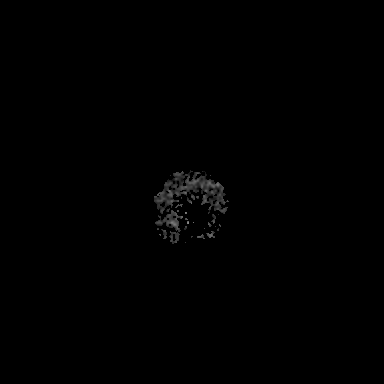

[Series 7: cor dwi_tracew · coronal · 5.0mm · 0.60mm/px · 3 of 38 slices shown]
[im 1/38]
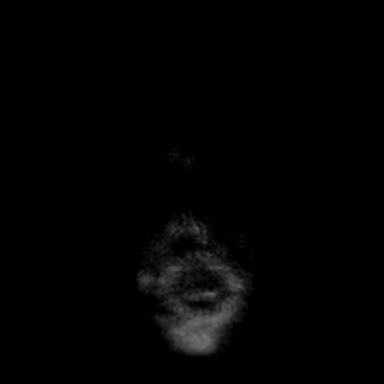
[im 19/38]
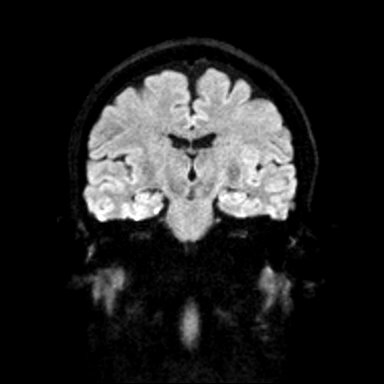
[im 38/38]
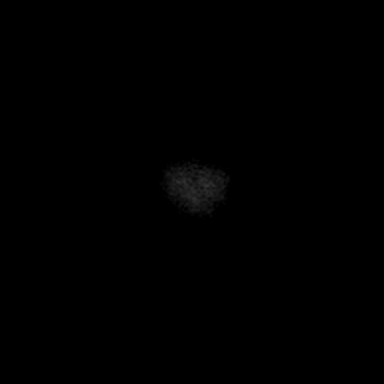

[Series 8: cor dwi_adc · coronal · 5.0mm · 0.60mm/px · 3 of 38 slices shown]
[im 1/38]
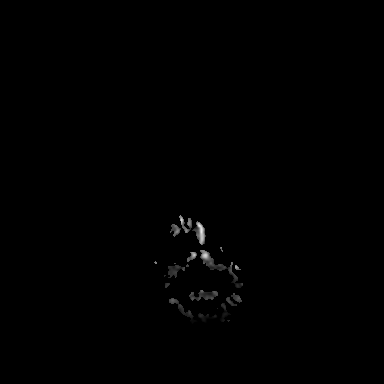
[im 19/38]
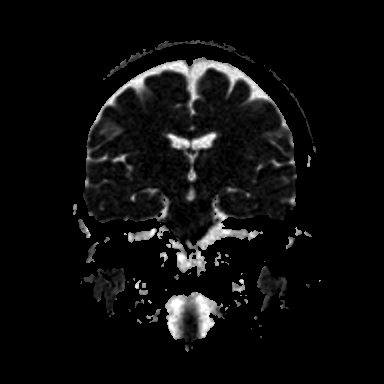
[im 38/38]
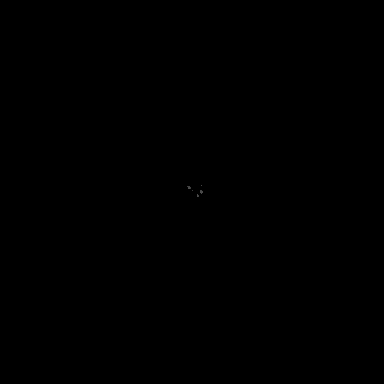

[Series 9: T1 · sagittal · 5.0mm · 0.62mm/px · 2 of 25 slices shown (1 of 2)]
[im 1/25]
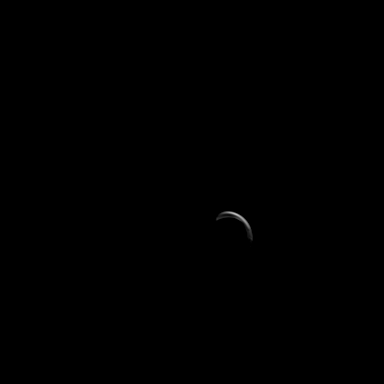
[im 25/25]
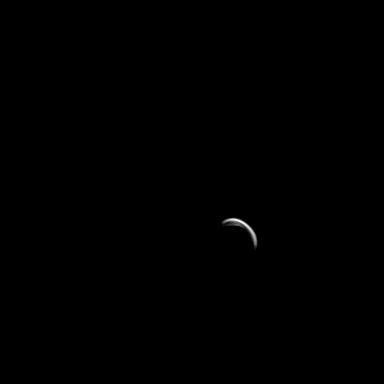

[Series 10: T2 · axial · 5.0mm · 0.53mm/px · z∈[-160,-25]mm · 2 of 25 slices shown]
[im 1/25]
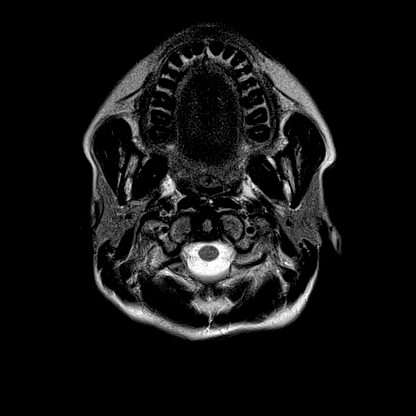
[im 25/25]
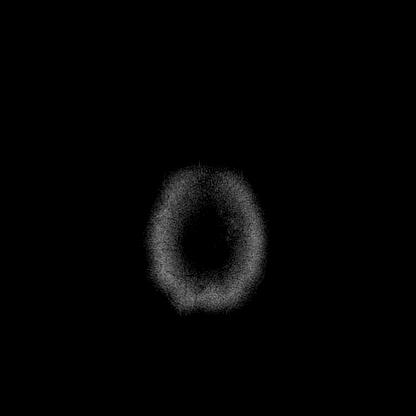

[Series 11: mag_images · axial · 3.0mm · 0.90mm/px · z∈[-177,-10]mm · 4 of 60 slices shown]
[im 1/60]
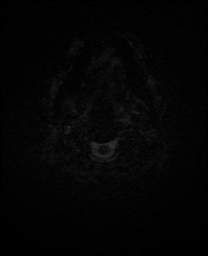
[im 20/60]
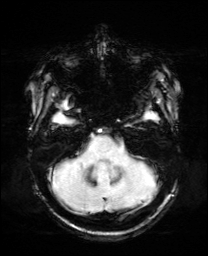
[im 40/60]
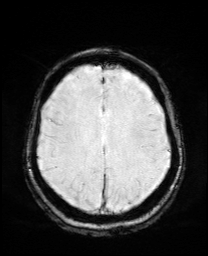
[im 60/60]
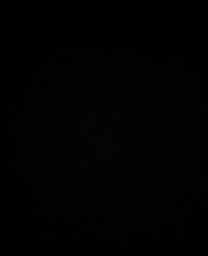

[Series 12: pha_images · axial · 3.0mm · 0.90mm/px · z∈[-177,-21]mm · 4 of 56 slices shown]
[im 1/56]
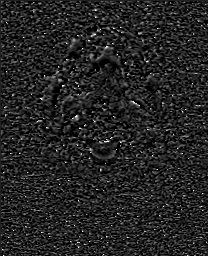
[im 19/56]
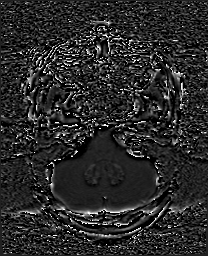
[im 37/56]
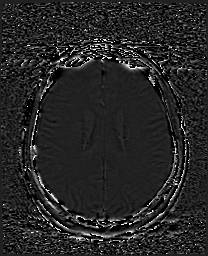
[im 56/56]
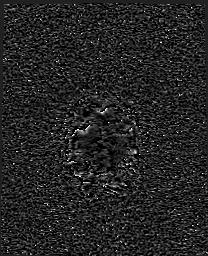

[Series 13: swi_images · axial · 3.0mm · 0.90mm/px · z∈[-177,-10]mm · 4 of 60 slices shown]
[im 1/60]
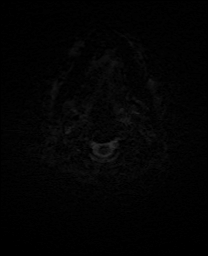
[im 20/60]
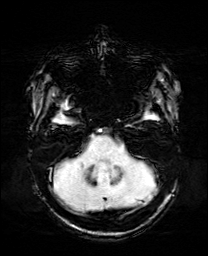
[im 40/60]
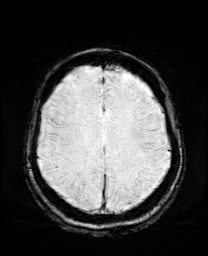
[im 60/60]
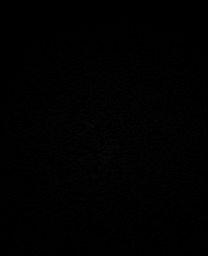

[Series 15: FLAIR · axial · 3.0mm · 0.53mm/px · z∈[-168,-15]mm · 4 of 55 slices shown]
[im 1/55]
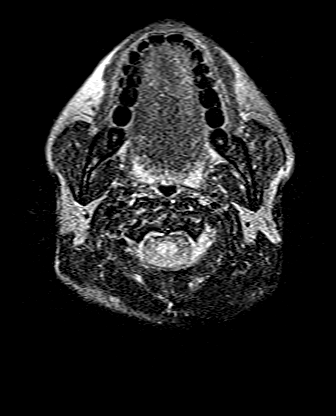
[im 19/55]
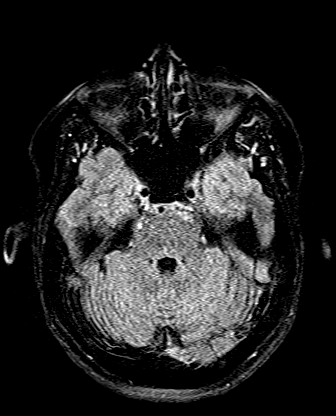
[im 37/55]
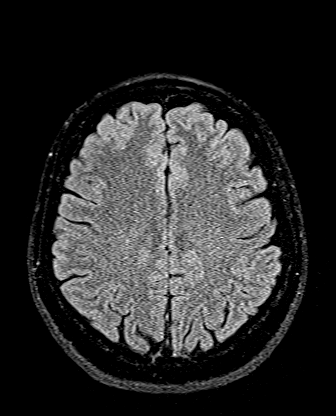
[im 55/55]
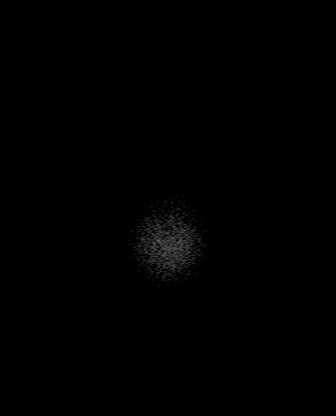

[Series 16: T1 · axial · 1.0mm · 0.98mm/px · z∈[-176,-11]mm · 13 of 176 slices shown (2 of 2)]
[im 1/176]
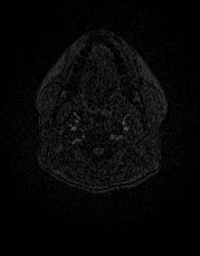
[im 15/176]
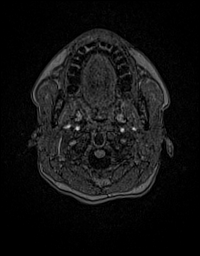
[im 30/176]
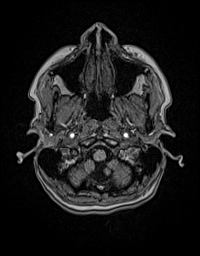
[im 44/176]
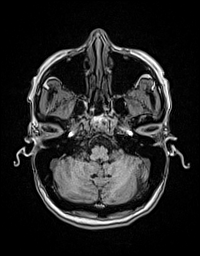
[im 59/176]
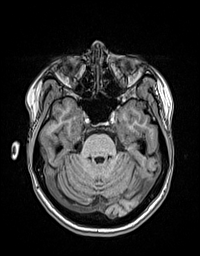
[im 73/176]
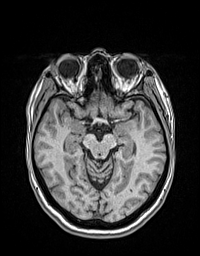
[im 88/176]
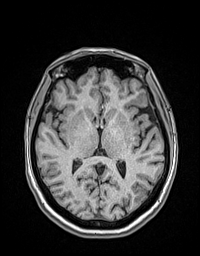
[im 103/176]
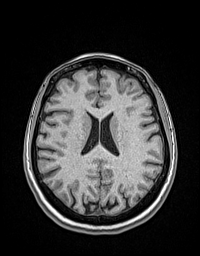
[im 117/176]
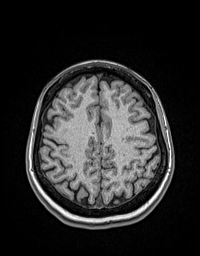
[im 132/176]
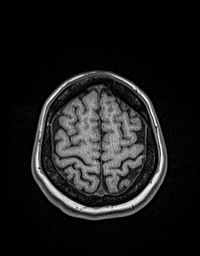
[im 146/176]
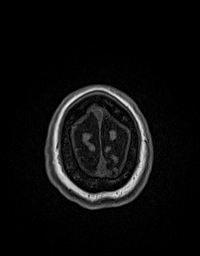
[im 161/176]
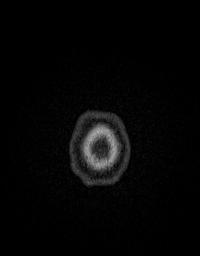
[im 176/176]
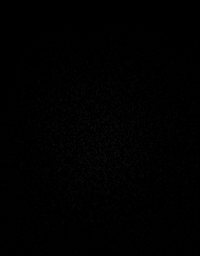

[Series 17: T2 post-contrast · coronal · 5.0mm · 0.57mm/px · 2 of 29 slices shown]
[im 1/29]
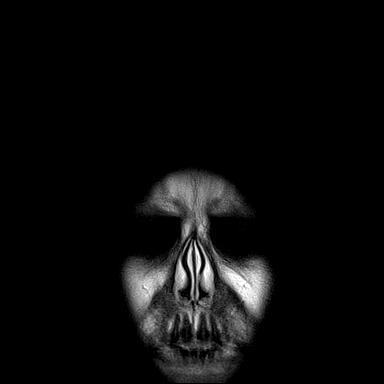
[im 29/29]
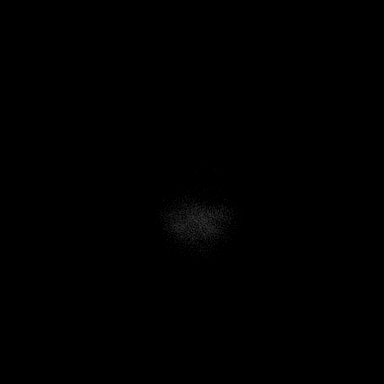

[48 of 48 positions shown; findings below may reference images not displayed]

FINDINGS: Brain: No acute infarction, hemorrhage, hydrocephalus, extra-axial
collection or mass lesion. The brain parenchyma has normal
morphology and signal characteristics.

Vascular: Normal flow voids.

Skull and upper cervical spine: Normal marrow signal.

Sinuses/Orbits: Negative.

Other: None.
IMPRESSION: Unremarkable MRI of the brain.

## 2022-04-11 ENCOUNTER — Other Ambulatory Visit: Payer: Self-pay | Admitting: Student

## 2022-04-11 DIAGNOSIS — G43809 Other migraine, not intractable, without status migrainosus: Secondary | ICD-10-CM

## 2023-03-04 ENCOUNTER — Ambulatory Visit
Admission: EM | Admit: 2023-03-04 | Discharge: 2023-03-04 | Disposition: A | Discharge status: Home / Self Care | Payer: BC Managed Care – PPO

## 2023-03-04 DIAGNOSIS — R1032 Left lower quadrant pain: Secondary | ICD-10-CM | POA: Diagnosis not present

## 2023-03-04 NOTE — Discharge Instructions (Addendum)
Please go to the emergency department at [facility] for evaluation of your abdominal pain and to rule out diverticulitis or diverticular abscess formation.

## 2023-03-04 NOTE — ED Provider Notes (Signed)
[facility]    CSN: [id number] Arrival date & time: 03/04/23  1319      History   Chief Complaint Chief Complaint  Patient presents with   Abdominal Pain   Shoulder Pain   Dizziness    HPI Patricia Zhang is a 57 y.o. female.   HPI  57 year old female with a past medical history significant for vestibular migraines, polycystic ovaries, fibromyalgia, and anemia presents for evaluation of 3 days of constant left-sided abdominal pain.  She reports that it is a 5/10 at base and will go up to an 8/10 when it intensifies.  It is never completely gone away.  She also reports that she has had some abdominal bloating.  In addition she has been complaining of pain in the left side of her neck and also some intermittent dizziness.  She does have a history of vertigo that comes with her migraines and she reports that this dizziness feels similar as it is room spinning in nature.  She denies any nausea, vomiting, diarrhea, or urinary symptoms.  She does not carry a diagnosis of diverticulitis since her diverticulosis but she states that it has been mentioned as a possibility in the past.  She reports that she has had some constipation but reports that her last bowel movement was this morning.  It was not hard but it was small volume.  Past Medical History:  Diagnosis Date   Anemia    H/O   Fibromyalgia    Polycystic ovaries    PONV (postoperative nausea and vomiting)    Vestibular migraine     There are no problems to display for this patient.   Past Surgical History:  Procedure Laterality Date   ABDOMINAL HYSTERECTOMY     APPENDECTOMY     EXCISION METACARPAL MASS Right 05/20/2015   Procedure: EXCISION METACARPAL MASS;  Surgeon: Kennedy Bucker, MD;  Location: [facility] ORS;  Service: Orthopedics;  Laterality: Right;    OB History   No obstetric history on file.      Home Medications    Prior to Admission medications   Medication Sig Start Date End Date Taking? Authorizing  Provider  rosuvastatin (CRESTOR) 5 MG tablet Take 5 mg by mouth daily.   Yes [provider]  cholecalciferol (VITAMIN D) 1000 UNITS tablet Take 1,000 Units by mouth daily.    [provider]  ibuprofen (ADVIL,MOTRIN) 200 MG tablet Take 800 mg by mouth every 6 (six) hours as needed.    [provider]  Magnesium Oxide 250 MG TABS Take 1 tablet by mouth daily.    [provider]  vitamin B-12 (CYANOCOBALAMIN) 1000 MCG tablet Take 1,000 mcg by mouth daily.    [provider]    Family History Family History  Problem Relation Age of Onset   Breast cancer Neg Hx     Social History Social History   Tobacco Use   Smoking status: Never   Smokeless tobacco: Never  Substance Use Topics   Alcohol use: Yes    Comment: RARE   Drug use: No     Allergies   Codeine   Review of Systems Review of Systems  Constitutional:  Negative for fever.  Gastrointestinal:  Positive for abdominal pain and constipation. Negative for blood in stool, diarrhea and nausea.  Musculoskeletal:  Positive for neck pain.  Neurological:  Positive for dizziness.     Physical Exam Triage Vital Signs ED Triage Vitals  Enc Vitals Group  BP 03/04/23 1426 116/75     Pulse --      Resp 03/04/23 1426 17     Temp 03/04/23 1426 99 F (37.2 C)     Temp Source 03/04/23 1426 Oral     SpO2 03/04/23 1426 95 %     Weight 03/04/23 1425 130 lb (59 kg)     Height --      Head Circumference --      Peak Flow --      Pain Score 03/04/23 1425 7     Pain Loc --      Pain Edu? --      Excl. in GC? --    No data found.  Updated Vital Signs BP 116/75 (BP Location: Right Arm)   Temp 99 F (37.2 C) (Oral)   Resp 17   Wt 130 lb (59 kg)   SpO2 95%   BMI 23.78 kg/m   Visual Acuity Right Eye Distance:   Left Eye Distance:   Bilateral Distance:    Right Eye Near:   Left Eye Near:    Bilateral Near:     Physical Exam Vitals and nursing note reviewed.   Constitutional:      Appearance: Normal appearance. She is not ill-appearing.  HENT:     Head: Normocephalic and atraumatic.     Right Ear: Tympanic membrane, ear canal and external ear normal. There is no impacted cerumen.     Left Ear: Tympanic membrane, ear canal and external ear normal. There is no impacted cerumen.  Eyes:     General: No scleral icterus.    Extraocular Movements: Extraocular movements intact.     Conjunctiva/sclera: Conjunctivae normal.     Pupils: Pupils are equal, round, and reactive to light.  Cardiovascular:     Rate and Rhythm: Normal rate and regular rhythm.     Pulses: Normal pulses.     Heart sounds: Normal heart sounds. No murmur heard.    No friction rub. No gallop.  Pulmonary:     Effort: Pulmonary effort is normal.     Breath sounds: Normal breath sounds. No wheezing, rhonchi or rales.  Abdominal:     General: Abdomen is flat.     Palpations: Abdomen is soft.     Tenderness: There is abdominal tenderness. There is guarding. There is no rebound.  Skin:    General: Skin is warm and dry.     Capillary Refill: Capillary refill takes less than 2 seconds.     Findings: No erythema or rash.  Neurological:     General: No focal deficit present.     Mental Status: She is alert and oriented to person, place, and time.      UC Treatments / Results  Labs (all labs ordered are listed, but only abnormal results are displayed) Labs Reviewed - No data to display  EKG   Radiology No results found.  Procedures Procedures (including critical care time)  Medications Ordered in UC Medications - No data to display  Initial Impression / Assessment and Plan / UC Course  I have reviewed the triage vital signs and the nursing notes.  Pertinent labs & imaging results that were available during my care of the patient were reviewed by me and considered in my medical decision making (see chart for details).   The patient is a pleasant, nontoxic-appearing  57 year old female presenting for evaluation of left-sided abdominal pain as outlined HPI above.  On exam patient's abdomen is flat but  she has marked tenderness in the left side of her abdomen with guarding in the left lower quadrant.  Her exam is concerning for possible diverticulitis.  She does not carry a formal diagnosis but states that it has been mentioned in the past.  She has not run a fever at home but her temp is elevated here in clinic at 99.  Given that patient has had constant abdominal pain for 3 days at a 5/10 with increases to a maximum of 8/10 I feel that the patient needs imaging of her abdomen to evaluate for possible diverticulitis and to rule out abscess formation or perforation.  She has elected to go via POV to [facility] for further evaluation.   Final Clinical Impressions(s) / UC Diagnoses   Final diagnoses:  Left lower quadrant abdominal pain     Discharge Instructions      Please go to the emergency department at [facility] for evaluation of your abdominal pain and to rule out diverticulitis or diverticular abscess formation.     ED Prescriptions   None    PDMP not reviewed this encounter.   Becky Augusta, NP 03/04/23 [phone]

## 2023-03-04 NOTE — ED Triage Notes (Signed)
Stomach pain for 3 days. Left side pain. Left side shoulder pain. Dizzy.

## 2024-03-05 ENCOUNTER — Other Ambulatory Visit: Payer: Self-pay | Admitting: Unknown Physician Specialty

## 2024-03-05 DIAGNOSIS — R519 Headache, unspecified: Secondary | ICD-10-CM

## 2024-03-10 ENCOUNTER — Other Ambulatory Visit

## 2024-03-10 DIAGNOSIS — R519 Headache, unspecified: Secondary | ICD-10-CM
